# Patient Record
Sex: Female | Born: 1980 | Race: Black or African American | Hispanic: No | Marital: Married | Smoking: Never smoker
Health system: Southern US, Community
[De-identification: ages and names within clinical notes are randomized; demographics above are authoritative.]

## PROBLEM LIST (undated history)

## (undated) ENCOUNTER — Inpatient Hospital Stay (HOSPITAL_COMMUNITY): Payer: Self-pay

## (undated) DIAGNOSIS — J45909 Unspecified asthma, uncomplicated: Secondary | ICD-10-CM

## (undated) DIAGNOSIS — I10 Essential (primary) hypertension: Secondary | ICD-10-CM

## (undated) DIAGNOSIS — L309 Dermatitis, unspecified: Secondary | ICD-10-CM

---

## 2004-11-05 ENCOUNTER — Ambulatory Visit (HOSPITAL_COMMUNITY): Admission: RE | Admit: 2004-11-05 | Discharge: 2004-11-05 | Payer: Self-pay | Admitting: Neurology

## 2005-02-24 DIAGNOSIS — O139 Gestational [pregnancy-induced] hypertension without significant proteinuria, unspecified trimester: Secondary | ICD-10-CM

## 2005-05-29 ENCOUNTER — Observation Stay (HOSPITAL_COMMUNITY): Admission: AD | Admit: 2005-05-29 | Discharge: 2005-05-30 | Payer: Self-pay | Admitting: Obstetrics & Gynecology

## 2005-11-24 ENCOUNTER — Inpatient Hospital Stay (HOSPITAL_COMMUNITY): Admission: AD | Admit: 2005-11-24 | Discharge: 2005-11-27 | Payer: Self-pay | Admitting: Obstetrics & Gynecology

## 2008-02-25 HISTORY — PX: DILATION AND CURETTAGE OF UTERUS: SHX78

## 2010-03-17 ENCOUNTER — Encounter: Payer: Self-pay | Admitting: Neurology

## 2011-10-09 ENCOUNTER — Other Ambulatory Visit: Payer: Self-pay | Admitting: Family Medicine

## 2011-10-09 DIAGNOSIS — L989 Disorder of the skin and subcutaneous tissue, unspecified: Secondary | ICD-10-CM

## 2011-10-10 ENCOUNTER — Ambulatory Visit
Admission: RE | Admit: 2011-10-10 | Discharge: 2011-10-10 | Disposition: A | Discharge status: Home / Self Care | Payer: BC Managed Care – PPO | Source: Ambulatory Visit | Attending: Family Medicine | Admitting: Family Medicine

## 2011-10-10 DIAGNOSIS — L989 Disorder of the skin and subcutaneous tissue, unspecified: Secondary | ICD-10-CM

## 2011-10-13 ENCOUNTER — Other Ambulatory Visit: Payer: Self-pay

## 2014-01-23 IMAGING — US US EXTREM LOW*R* LIMITED
1 series · 9 of 9 positions shown · non-contrast
Comparison: None.

CLINICAL DATA: Palpable nodule shin   lower leg

ULTRASOUND RIGHT LOWER EXTREMITY LIMITED
TECHNIQUE: Ultrasound examination of the region of interest in the
right lower extremity was performed.

[Series 1: us extrem low*right* limited · 0.05mm/px · 9 of 9 slices shown]
[im 1/9]
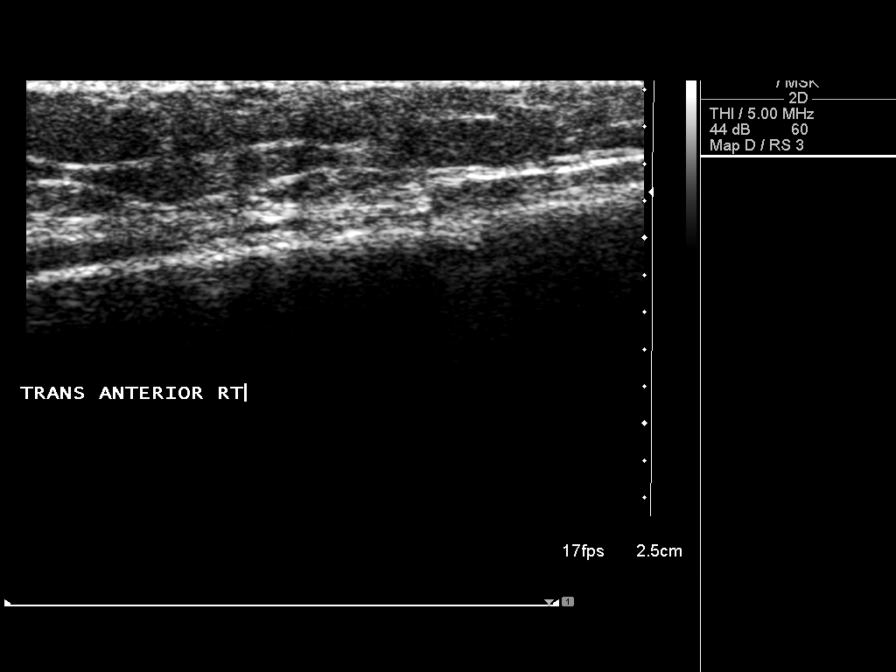
[im 2/9]
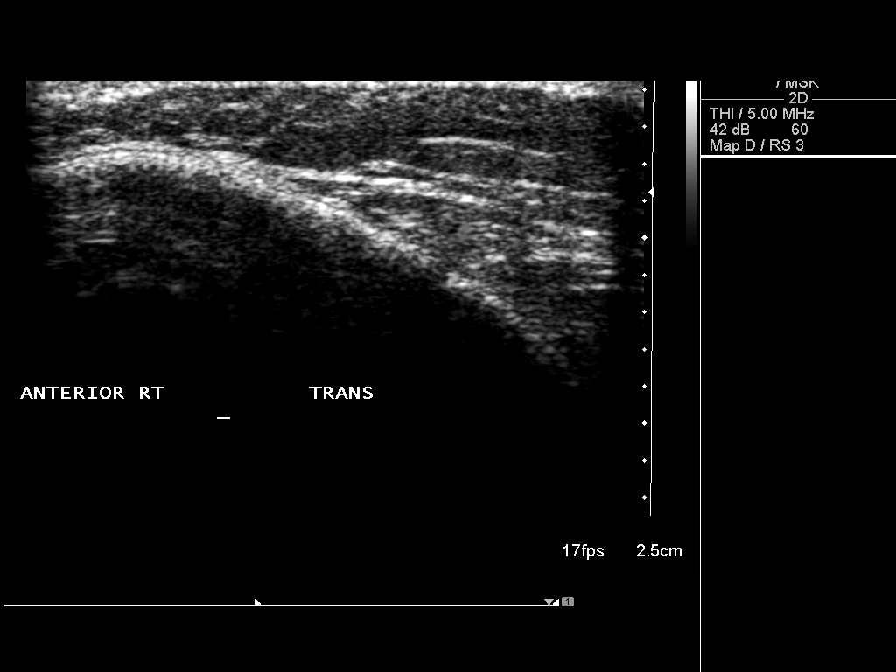
[im 3/9]
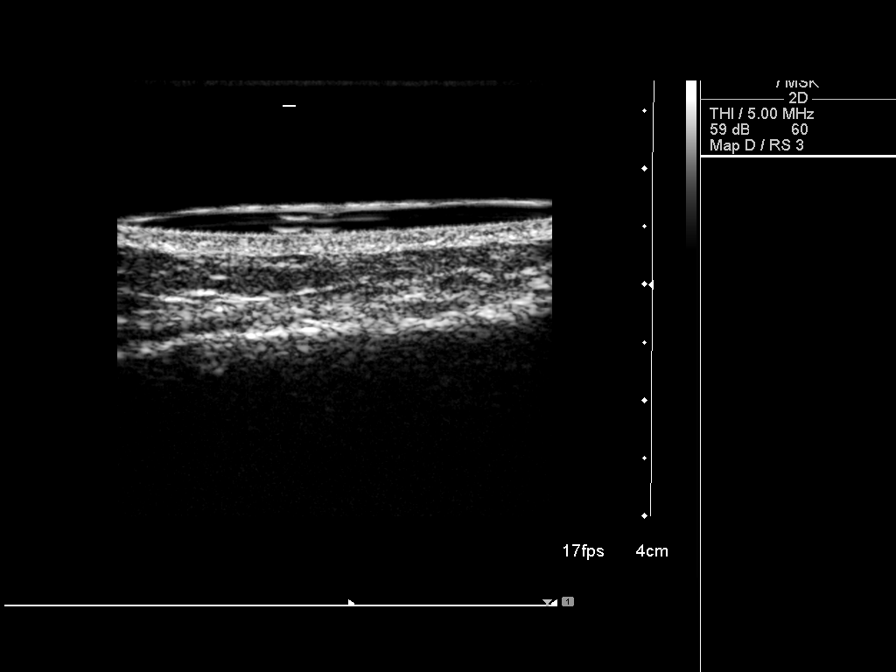
[im 4/9]
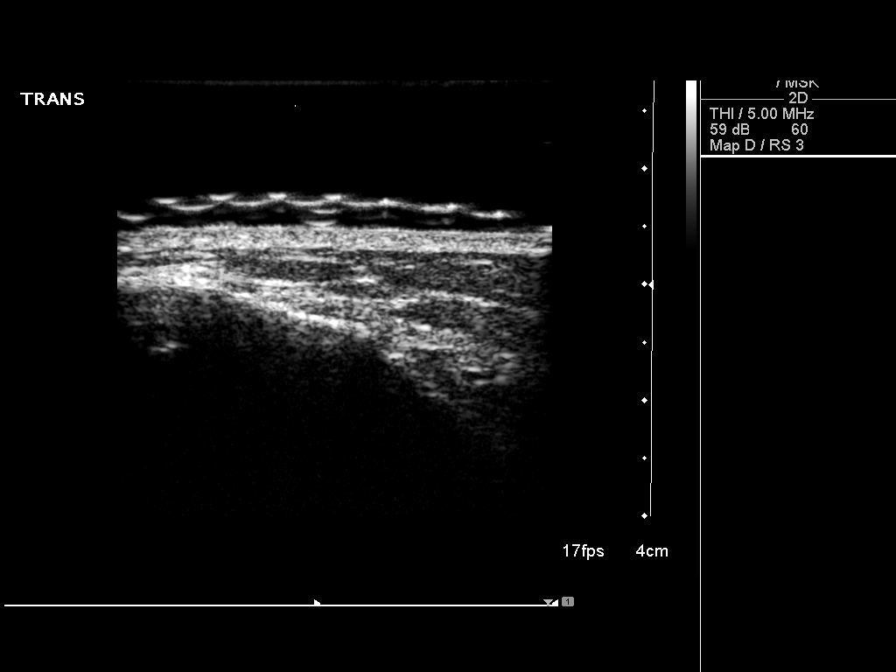
[im 5/9]
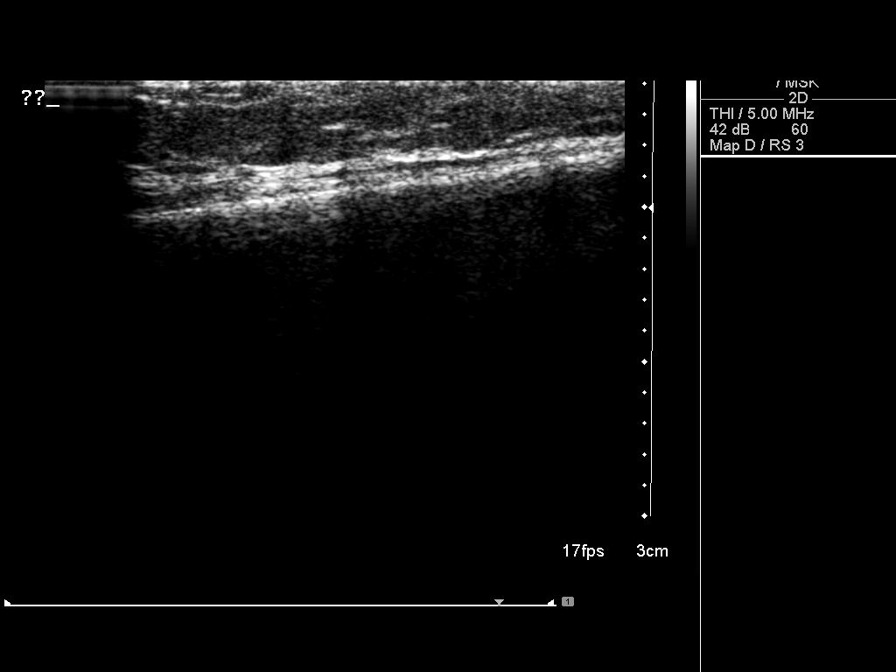
[im 6/9]
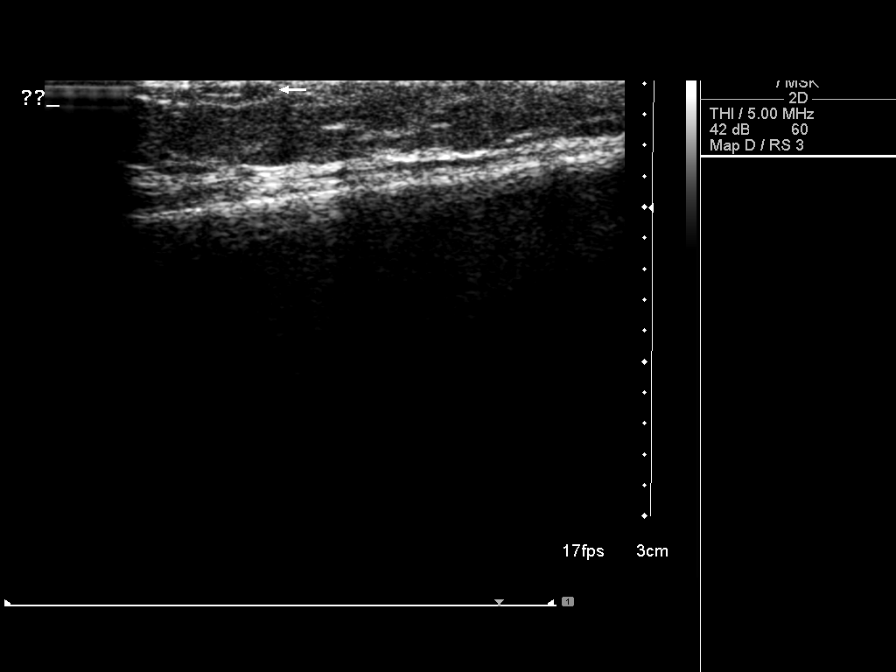
[im 7/9]
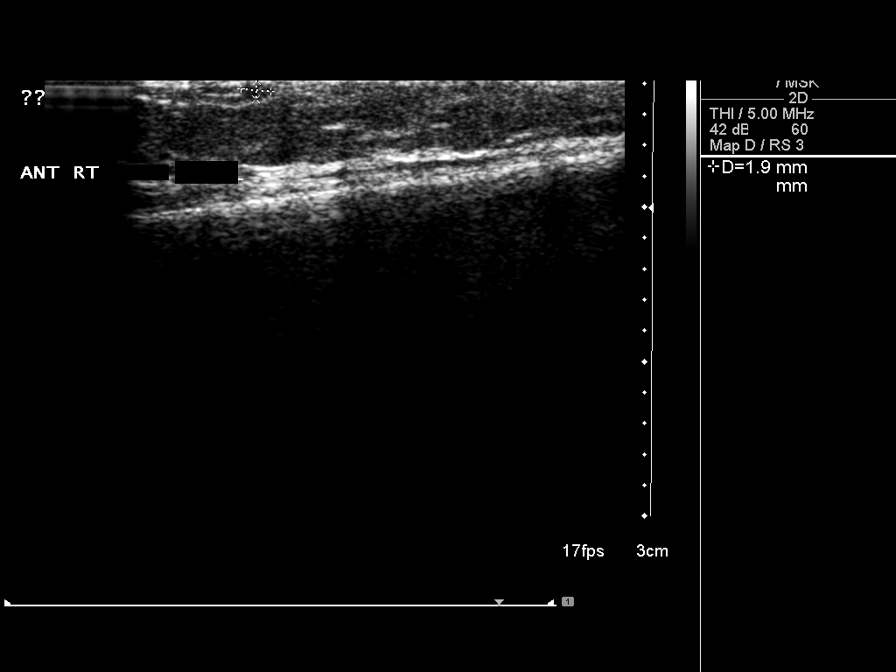
[im 8/9]
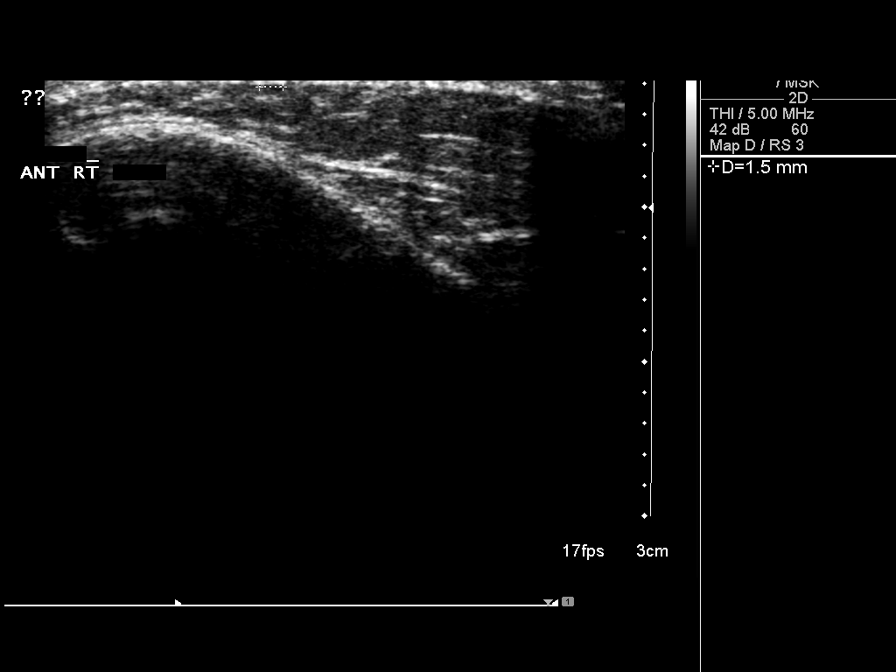
[im 9/9]
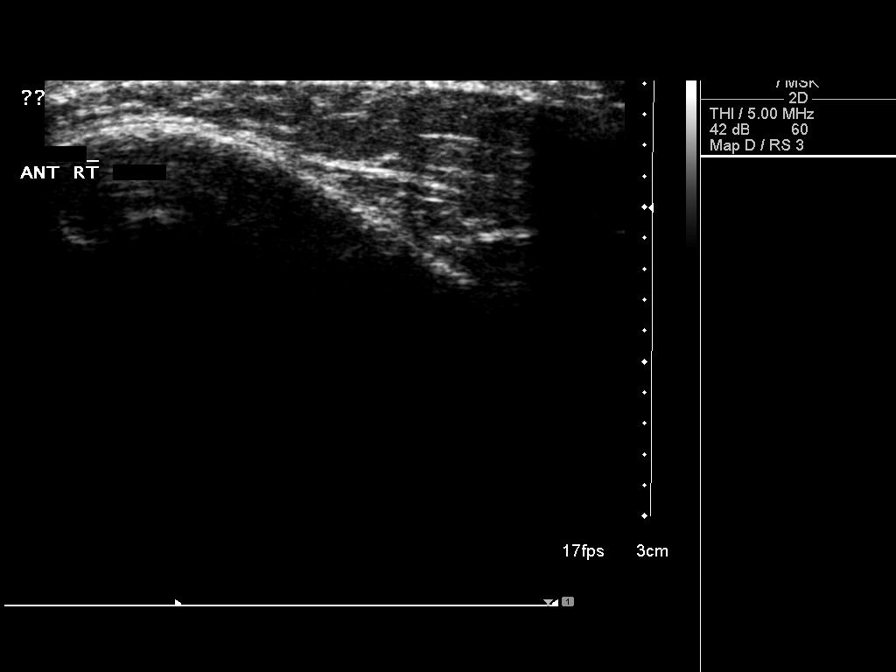

[9 of 9 positions shown; findings below may reference images not displayed]

FINDINGS: Palpable nodule is very small in the subcutaneous
tissues.  This is difficult to identify by ultrasound.  There is a
questionable hypoechoic nodule measuring 1.2 x 1.9 mm in the
subcutaneous tissues.  This is nonspecific and could be due to
scarring or chronic infection.
IMPRESSION: Small palpable abnormality is difficult to identify by ultrasound.
No worrisome cystic or solid mass is identified.

## 2016-01-23 ENCOUNTER — Ambulatory Visit: Payer: Self-pay | Admitting: Sports Medicine

## 2016-02-13 ENCOUNTER — Ambulatory Visit: Payer: Self-pay | Admitting: Sports Medicine

## 2017-06-03 ENCOUNTER — Other Ambulatory Visit: Payer: Self-pay

## 2017-06-03 ENCOUNTER — Inpatient Hospital Stay (HOSPITAL_COMMUNITY)
Admission: AD | Admit: 2017-06-03 | Discharge: 2017-06-05 | DRG: 831 | Disposition: A | Discharge status: Home / Self Care | Payer: 59 | Source: Ambulatory Visit | Attending: Obstetrics and Gynecology | Admitting: Obstetrics and Gynecology

## 2017-06-03 ENCOUNTER — Inpatient Hospital Stay (HOSPITAL_COMMUNITY): Payer: 59

## 2017-06-03 ENCOUNTER — Encounter (HOSPITAL_COMMUNITY): Payer: Self-pay

## 2017-06-03 DIAGNOSIS — Z3A28 28 weeks gestation of pregnancy: Secondary | ICD-10-CM

## 2017-06-03 DIAGNOSIS — Z880 Allergy status to penicillin: Secondary | ICD-10-CM | POA: Diagnosis not present

## 2017-06-03 DIAGNOSIS — O09523 Supervision of elderly multigravida, third trimester: Secondary | ICD-10-CM

## 2017-06-03 DIAGNOSIS — O368330 Maternal care for abnormalities of the fetal heart rate or rhythm, third trimester, not applicable or unspecified: Secondary | ICD-10-CM | POA: Diagnosis not present

## 2017-06-03 DIAGNOSIS — O368390 Maternal care for abnormalities of the fetal heart rate or rhythm, unspecified trimester, not applicable or unspecified: Secondary | ICD-10-CM | POA: Diagnosis present

## 2017-06-03 HISTORY — DX: Essential (primary) hypertension: I10

## 2017-06-03 HISTORY — DX: Unspecified asthma, uncomplicated: J45.909

## 2017-06-03 LAB — COMPREHENSIVE METABOLIC PANEL
ALT: 12 U/L — ABNORMAL LOW (ref 14–54)
AST: 17 U/L (ref 15–41)
Albumin: 2.9 g/dL — ABNORMAL LOW (ref 3.5–5.0)
Alkaline Phosphatase: 51 U/L (ref 38–126)
Anion gap: 7 (ref 5–15)
BUN: 12 mg/dL (ref 6–20)
CHLORIDE: 105 mmol/L (ref 101–111)
CO2: 23 mmol/L (ref 22–32)
Calcium: 8.2 mg/dL — ABNORMAL LOW (ref 8.9–10.3)
Creatinine, Ser: 0.5 mg/dL (ref 0.44–1.00)
GFR calc Af Amer: >60 mL/min (ref >60)
GFR calc non Af Amer: >60 mL/min (ref >60)
Glucose, Bld: 73 mg/dL (ref 65–99)
POTASSIUM: 4.3 mmol/L (ref 3.5–5.1)
Sodium: 135 mmol/L (ref 135–145)
Total Bilirubin: 1 mg/dL (ref 0.3–1.2)
Total Protein: 5.8 g/dL — ABNORMAL LOW (ref 6.5–8.1)

## 2017-06-03 LAB — GROUP B STREP BY PCR: GROUP B STREP BY PCR: NEGATIVE

## 2017-06-03 LAB — TYPE AND SCREEN
ABO/RH(D): O POS
Antibody Screen: NEGATIVE

## 2017-06-03 LAB — RAPID HIV SCREEN (HIV 1/2 AB+AG)
HIV 1/2 Antibodies: NONREACTIVE
HIV-1 P24 Antigen - HIV24: NONREACTIVE

## 2017-06-03 LAB — ABO/RH: ABO/RH(D): O POS

## 2017-06-03 MED ORDER — ZOLPIDEM TARTRATE 5 MG PO TABS: 5.0000 mg | ORAL_TABLET | Freq: Every evening | ORAL | Status: DC | PRN

## 2017-06-03 MED ORDER — PRENATAL MULTIVITAMIN CH: 1.0000 | ORAL_TABLET | Freq: Every day | ORAL | Status: DC

## 2017-06-03 MED ORDER — DOCUSATE SODIUM 100 MG PO CAPS: 100.0000 mg | ORAL_CAPSULE | Freq: Every day | ORAL | Status: DC

## 2017-06-03 MED ORDER — LACTATED RINGERS IV BOLUS
1000.0000 mL | Freq: Once | INTRAVENOUS | Status: AC
  Administered 2017-06-03: 1000 mL via INTRAVENOUS

## 2017-06-03 MED ORDER — CALCIUM CARBONATE ANTACID 500 MG PO CHEW: 2.0000 | CHEWABLE_TABLET | ORAL | Status: DC | PRN

## 2017-06-03 MED ORDER — BETAMETHASONE SOD PHOS & ACET 6 (3-3) MG/ML IJ SUSP
12.0000 mg | INTRAMUSCULAR | Status: AC
  Administered 2017-06-03 – 2017-06-04 (×2): 12 mg via INTRAMUSCULAR
  Filled 2017-06-03 (×2): qty 2

## 2017-06-03 MED ORDER — MAGNESIUM SULFATE BOLUS VIA INFUSION
4.0000 g | Freq: Once | INTRAVENOUS | Status: AC
  Administered 2017-06-03: 4 g via INTRAVENOUS
  Filled 2017-06-03: qty 500

## 2017-06-03 MED ORDER — MAGNESIUM SULFATE 40 G IN LACTATED RINGERS - SIMPLE
2.0000 g/h | INTRAVENOUS | Status: AC
  Administered 2017-06-03: 2 g/h via INTRAVENOUS
  Filled 2017-06-03: qty 40

## 2017-06-03 MED ORDER — ACETAMINOPHEN 325 MG PO TABS: 650.0000 mg | ORAL_TABLET | ORAL | Status: DC | PRN

## 2017-06-03 MED ORDER — LACTATED RINGERS IV SOLN
INTRAVENOUS | Status: DC
  Administered 2017-06-03: 19:00:00 via INTRAVENOUS

## 2017-06-03 NOTE — MAU Note (Signed)
Pt sent by Dr. Cherly Hensenousins for BPP for elevated FHR in office. Patient denies pain

## 2017-06-03 NOTE — Consult Note (Signed)
Neonatology Consult Note:  At the request of the patients obstetrician Dr. Murrell Redden I met with Geanie Cooley Ahmed-Goethe who is a 37 y.o. [id number] at 11w4dgestation.  Her husband was also present for the consult via phone.    She presented from the office this afternoon due to fetal tachycardia noted on nst.  She was referred to MAU for further evaluation including ivf and BPP performed.  The BPP was 8/8 however an UKore[place]owed evidence of pericardial effusion.  She is also have mild contractions.    Antepartum course: Complicated by episode of urinary retention but this has resolved.  AMA with negative genetic screening.  Hgb C trait  The current plan of care includes TORCH titers, varicella, parvo, type/screen to eval for hydrops.  RPR sent from office today and results pending.  If the fetus is persistently tachycardic above 200 without a clear cause she may need a fetal echo and peds cardiology consult.  She is receiving betamethasone and magnesium sulfate both as a tocolytic and for neuro protection.  We discussed morbidity/mortality at this gestional age and also discussed the implications that the pericardial effusion may have on his care. We discussed delivery room resuscitation, including intubation and surfactant in the DR.  Discussed mechanical ventilation and risk for chronic lung disease, risk for IVH with potential for motor / cognitive deficits, ROP, NEC, sepsis, as well as temperature instability and feeding immaturity.  Discussed NG / OG feeds, benefits of MBM in reducing incidence of NEC.   Discussed likely length of stay.  Thank you for allowing [place] participate in her care.  Please call with questions.  BHiginio Roger DO  Neonatologist  The total length of face-to-face or floor / unit time for this encounter was 30 minutes.  Counseling and / or coordination of care was greater than fifty percent of the time.

## 2017-06-03 NOTE — MAU Provider Note (Signed)
History     Chief Complaint  Patient presents with  . Non-stress Test  elevated fetal heart rate   36 yo G4P2012 MBF @ 28 6/[redacted] weeks gestation sent from office for further evaluation of fetal tachycardia noted in the office on routine exam. FHR 180's to 190's No maternal fever or recent viral infection. (+) FM denies ctx. Nl 1hr GCT. Nl wbc on CBC. Maternal pulse 85 in office OB History    Gravida  4   Para  2   Term  2   Preterm      AB  1   Living  2     SAB  1   TAB      Ectopic      Multiple      Live Births  2           No past medical history on file.    No family history on file.  Social History   Tobacco Use  . Smoking status: Not on file  Substance Use Topics  . Alcohol use: Not on file  . Drug use: Not on file    Allergies:  Allergies  Allergen Reactions  . Penicillins Nausea And Vomiting    No medications prior to admission.     Physical Exam   Blood pressure 111/73, pulse 86, temperature 98.3 F (36.8 C), temperature source Oral, resp. rate 18, height 5\' 3"  (1.6 m), weight 59.5 kg (131 lb 3.2 oz), SpO2 100 %.  No exam performed today, done in office. VE( office) l/c/p firm Tracing: baseline 180's min variability (+) ctx  ED Course  IMP: fetal tachycardia IUP@ 28 6/7 weeks P) BPP/EFW. MFM consult  MDM   Serita KyleSheronette A Annalia Metzger, MD 1:12 PM 06/03/2017

## 2017-06-03 NOTE — Progress Notes (Signed)
Patient sitting up eating dinner, will readjust monitor when she is finished.

## 2017-06-03 NOTE — MAU Note (Signed)
Pt sent from office for prolonged FHR in 180-190's

## 2017-06-03 NOTE — H&P (Addendum)
Laura Morgan is a 37 y.o. [id number] at 50w4dgestation presents from office d/t fetal tachycardia noted on nst that persisted.  Pt then sent to MAU for further evaluation including ivf and bpp performed.  Patient denies any fever, chills, sweats, recent known infection, no known sick contacts.  Denies any vaginal bleeding, lof, or contractions.  Notes fetal movement.   CBC/RPR/GTT done today at office. 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 Antepartum course: Complicated by episode of urinary retention but this has resolved.  [place] with negative genetic screening[place] [facility] [place]  See complete pre-natal records  History OB History    Gravida  1   Para      Term      Preterm      AB      Living        SAB      TAB      Ectopic      Multiple      Live Births             PMH: Hgb C trait Family History: family history is not on file. Social History:  has no tobacco, alcohol, and drug history on file.  ROS: See above otherwise negative  Prenatal labs:  ABO, Rh:  pos Antibody:  neg Rubella:  immune RPR:   neg HBsAg:   neg HIV:  neg GBS:    1 hr Glucola: Normal Genetic screening: Normal Anatomy [place] Normal  Physical Exam:     Blood pressure 111/73, pulse 86, temperature 98.1 F (36.7 C), resp. rate 18, height '5\' 3"'$  (1.6 m), weight 131 lb 3.2 oz (59.5 kg), SpO2 100 %. A&O x 3 HEENT: Normal Lungs: CTAB CV: RRR Abdominal: Soft, Non-tender and Gravid Lower Extremities: Non-edematous, Non-tender  Pelvic Exam:      Dilatation: Closed     Effacement: Thick  Per exam by Dr. CGarwin Brothersat office  CBC:  No results found for: WBC, RBC, HGB, HCT, MCV, MCH, MCHC, RDW, PLT CMP: No results found for: NA, K, CL, CO2, GLUCOSE, BUN, CREATININE, CALCIUM, PROT, AST, ALT, ALBUMIN, ALKPHOS, BILITOT, GFRNONAA, GFRAA, ANIONGAP Urine: No results found for: COLORURINE, APPEARANCEUR, LABSPEC, PHURINE, GLUCOSEU, HGBUR, BILIRUBINUR, KETONESUR, PROTEINUR,  NITRITE, LEUKOCYTESUR   Prenatal Transfer Tool  Maternal Diabetes: No - done today and passed - 81 Genetic Screening: Normal Maternal Ultrasounds/Referrals: Normal until today's u/s showing pericardial effusion Fetal Ultrasounds or other Referrals:  None Maternal Substance Abuse:  No Significant Maternal Medications:  None Significant Maternal Lab Results: None  U/S today: bpp 8/8, afi 17.32cm, efw 469%(2'9"), cephalic, fht 1485and fetal tachy noted during entire exam; posterior placenta,  *PERICARDIAL EFFUSION noted  FHT: baseline 180s-190s, normal variability, few accelerations, no decelerations Toco: irregular contractions about q 2 min (pt cannot feel)  Assessment/Plan:  37y.o. G1P0 at 266w6destation   1. Persistent Fetal tachycardia with pleural effusion - admit for observation; pt is s/p iv hydration and tachy persisted - bpp done and fetal tachy noted during exam with concerning finding of pleural effusion.  Will get TORCH titers, varicella, parvo, type/screen to eval for hydrops.  Pt did have negative informaseq early in pregnancy and low risk.  RPR sent from office today and results pending.  CBC done in office today and WBC 7.2, H/h: 11.4/33.4, plts 209.  Will admit for observation and continuous monitoring.  Recommendations per MFM (Dr. NiNelda Severereviewed with Dr CoGarwin Brothersnd, in addition to above labs and  monitoring, watch for tachy persistently above 200 and no clear cause of tachy, may need fetal echo and peds cardiology consult (possible transplacental therapy).  Will monitor closely. 2.  AMA - neg nipt/afp 3. Hgb C trait 4. Contractions - cx closed in office, may require tocolytic if persist 5. Gtt nml 6. Prematurity - betamethasone x1 now, repeat in 24 hrs; nicu consult  Contractions have persisted - have reviewed findings with Dr Garwin Brothers who is primary provider and will begin Magnesium sulfate for neuro protection and also for ptc. - will follow closely  Charyl Bigger 06/03/2017, 5:48 PM

## 2017-06-04 LAB — HSV 1 ANTIBODY, IGG: HSV 1 GLYCOPROTEIN G AB, IGG: 1.15 {index} — AB (ref 0.00–0.90)

## 2017-06-04 LAB — CMV ANTIBODY, IGG (EIA): CMV AB - IGG: 0.88 U/mL — AB (ref 0.00–0.59)

## 2017-06-04 LAB — HSV 2 ANTIBODY, IGG

## 2017-06-04 LAB — VARICELLA ZOSTER ANTIBODY, IGM

## 2017-06-04 LAB — VARICELLA ZOSTER ANTIBODY, IGG: VARICELLA IGG: 1536 {index} (ref >165)

## 2017-06-04 LAB — RUBELLA SCREEN: Rubella: 4.32 index (ref >0.99)

## 2017-06-04 LAB — TOXOPLASMA GONDII ANTIBODY, IGG

## 2017-06-04 NOTE — Progress Notes (Signed)
Fetal echo ordered by Dr. Cherly Hensenousins. Dr. Elizebeth Brookingotton office contacted and will be unable to schedule as inpatient today or tomorrow, Cousins contacted to verify that echo could be scheduled as an outpatient procedure. [facility] April [facility] [place]. Consent obtained to provide medical information (report from MFM) to Dr. Elizebeth Brookingotton, and report faxed to office. Patient aware of appoint and address given.

## 2017-06-04 NOTE — Progress Notes (Signed)
HD #2 29 wk S/p magnesium  S: no complaint. Good FM Anxious to go home but concern regarding baby  O. BP 101/60 (BP Location: Right Arm)   Pulse 86   Temp 97.8 F (36.6 C) (Oral)   Resp 16   Ht 5\' 3"  (1.6 m)   Wt 59.5 kg (131 lb 3.2 oz)   LMP  (Exact Date)   SpO2 100%   BMI 23.24 kg/m  Lungs clear to A Cor RRR Abd: gravid nontender Pelvic deferred extr no edema  Tracing reviewed: baseline 160's min to mod variability. Some variable No ctx [place] Mfm Fetal Bpp Wo Non Stress  Result Date: 06/03/2017 ----------------------------------------------------------------------  OBSTETRICS REPORT                      (Signed Final 06/03/2017 04:35 pm) ---------------------------------------------------------------------- Patient Info  ID #:       [id number]                          D.O.B.:  07-07-1980 (36 yrs)  Name:       SARIKA BALDINI AHMED-                 Visit Date: 06/03/2017 03:14 pm              GOETHE ---------------------------------------------------------------------- Performed By  Performed By:     Fayne Norrie BS,      Ref. Address:     [facility]                                                             [facility]                                                             [facility]                                                             [address]                                                             [place], [place]                                                             [address]  Attending:        Rema Fendt  MD      [facility]         [facility]  Referred By:      Nena JordanSHERONETTE                    Chabeli Barsamian MD ---------------------------------------------------------------------- Orders   #  Description                                 Code   1  US MFM FETAL BPP WO NON STRESS              76819.01   2  US MFM OB DETAIL +14 WK                     76811.01  ----------------------------------------------------------------------   #  Ordered By                Order #        Accession #    Episode #   1  Nena JordanSHERONETTE               [id number]       [id number]     [id number]      Silvie Obremski   2  Nena JordanSHERONETTE               [id number]       [id number]     [id number]      Rebbecca Osuna  ---------------------------------------------------------------------- Indications   [redacted] weeks gestation of pregnancy                Z3A.28   Maternal care for fetal tachycardia during     O36.8390   pregnancy  ---------------------------------------------------------------------- OB History  Gravidity:    4         Term:   2         SAB:   1 ---------------------------------------------------------------------- Fetal Evaluation  Num Of Fetuses:     1  Fetal Heart         191  Rate(bpm):  Cardiac Activity:   Observed  Presentation:       Cephalic  Placenta:           Posterior, above cervical os  P. Cord Insertion:  Visualized, central  Amniotic Fluid  AFI FV:      Subjectively within normal limits  AFI Sum(cm)     %Tile       Largest Pocket(cm)  17.32           65          5.19  RUQ(cm)       RLQ(cm)       LUQ(cm)        LLQ(cm)  2.22          4.78          5.19           5.13 ---------------------------------------------------------------------- Biophysical Evaluation  Amniotic F.V:   Within normal limits       F. Tone:        Observed  F. Movement:    Observed                   Score:          8/8  F. Breathing:   Observed ---------------------------------------------------------------------- Biometry  BPD:        70  mm     G. Age:  28w  1d         24  %    CI:        71.64   %    70 - 86                                                          FL/HC:      19.9   %    19.6 - 20.8  HC:      263.3  mm     G. Age:  28w 4d         22  %    HC/AC:      1.10        0.99 - 1.21  AC:      238.3  mm     G. Age:  28w 1d         30  %    FL/BPD:     74.9   %    71 - 87  FL:       52.4  mm     G. Age:  27w 6d         19  %    FL/AC:      22.0   %    20 - 24  HUM:      47.3  mm     G. Age:  27w 6d         29  %   CER:      33.4  mm     G. Age:  29w 1d         59  %  Est. FW:    1175  gm      2 lb 9 oz     41  % ---------------------------------------------------------------------- Gestational Age  Clinical EDD:  28w 4d                                        EDD:   08/22/17  U/S Today:     28w 1d                                        EDD:   08/25/17  Best:          28w 4d     Det. By:  Clinical EDD             EDD:   08/22/17 ---------------------------------------------------------------------- Anatomy  Cranium:               Appears normal         Aortic Arch:            Appears normal  Cavum:                 Appears normal         Ductal Arch:            Not well visualized  Ventricles:            Appears normal         Diaphragm:  Not well visualized  Choroid Plexus:        Appears normal         Stomach:                Appears normal, left                                                                        sided  Cerebellum:            Appears normal         Abdomen:                Appears normal  Posterior Fossa:       Appears normal         Abdominal Wall:         Not well visualized  Nuchal Fold:           Not applicable (>20    Cord Vessels:           Appears normal ([redacted]                         wks GA)                                        vessel cord)  Face:                  Appears normal         Kidneys:                Appear normal                         (orbits and profile)  Lips:                  Appears normal         Bladder:                Appears normal  Heart:                 Appears normal         Spine:                  Appears normal                         (4CH, axis, and situs  RVOT:                  Not well visualized    Upper Extremities:      Appears normal; no                                                                        hands  LVOT:  Appears normal         Lower Extremities:      Appears normal  ---------------------------------------------------------------------- Cervix Uterus Adnexa  Cervix  Normal appearance by transabdominal scan, although lower uterine  segment contraction ---------------------------------------------------------------------- Comments  Fetal tachycardia was noted throughout today's ultrasound in  addition there is also a pericardial effusion present.  This is  concerning for evolving hydrops due to sustained fetal  tachycardia.  However, at this time it is unclear for how long,  or for what percentage of the time the fetus has been/is  tachycardic.  Thus, Ms. Ahmed-Goethe should receive  continuous fetal monitoring overnight to determine if the  tachycardia is sustained, and if so at what rate.  The work-up  should also include TORCH titers, parvo titers, and a type  and screen to look for immune hydrops.  Ms. Ahmed-Goethe  reports she had genetic screening, but I do not have those  record to review at this time.  Depending upon what has been  already done, additional genetic testing could also be  considered as a possible cause of non-immune hydrops.  If  the tachycardia persistently remains above 200 and no other  clear cause has been determiend it may be necessary to  begin transplacental therapy for the tachycardia.  Prior to  starting that I would recommend a fetal echocardiogram and  Pediatric Cardiology consultation.  Case discussed with Dr.  Cherly Hensen. ---------------------------------------------------------------------- Impression  Single living intrauterine pregnancy at 28 weeks 4 days.  Appropriate fetal growth (41%).  Normal amniotic fluid volume.  The fetal anatomic survey is not complete.  BPP 8/8.  Fetal tachycardia noted throughout today's exam.  A pleural effusion is seen. ---------------------------------------------------------------------- Recommendations  See comments. ----------------------------------------------------------------------                 Rema Fendt, MD Electronically Signed Final Report   06/03/2017 04:35 pm ----------------------------------------------------------------------  [place] Mfm Ob Detail +14 Wk  Result Date: 06/03/2017 ----------------------------------------------------------------------  OBSTETRICS REPORT                      (Signed Final 06/03/2017 04:35 pm) ---------------------------------------------------------------------- Patient Info  ID #:       [id number]                          D.O.B.:  [birth year] (36 yrs)  Name:       CARO BRUNDIDGE AHMED-                 Visit Date: 06/03/2017 03:14 pm              GOETHE ---------------------------------------------------------------------- Performed By  Performed By:     Fayne Norrie BS,      Ref. Address:     [facility]                    [facility]                                                             [facility]                                                             [address]                                                             [place], [place]                                                             [address]  Attending:        Rema Fendt MD      Location:         [facility]  Referred By:      Nena Jordan                    Temperence Zenor MD ---------------------------------------------------------------------- Orders   #  Description                                 Code   1  [place] MFM FETAL BPP WO NON STRESS              76819.01   2  [place] MFM OB DETAIL +14 WK                     76811.01  ----------------------------------------------------------------------   #  Ordered By               Order #        Accession #    Episode #   1  Nena Jordan               [id number]       [id number]     [id number]      Bijon Mineer   2  Nena Jordan               [id number]       [id number]     [id number]      Aira Sallade  ---------------------------------------------------------------------- Indications   [redacted] weeks gestation of  pregnancy                Z3A.28   Maternal care for fetal tachycardia during     O36.8390   pregnancy  ---------------------------------------------------------------------- OB History  Gravidity:    4         Term:   2         SAB:   1 ---------------------------------------------------------------------- Fetal Evaluation  Num Of Fetuses:     1  Fetal Heart         191  Rate(bpm):  Cardiac Activity:   Observed  Presentation:       Cephalic  Placenta:           Posterior, above cervical os  P. Cord Insertion:  Visualized, central  Amniotic Fluid  AFI FV:      Subjectively within normal limits  AFI Sum(cm)     %Tile       Largest Pocket(cm)  17.32           65          5.19  RUQ(cm)       RLQ(cm)       LUQ(cm)        LLQ(cm)  2.22          4.78          5.19           5.13 ---------------------------------------------------------------------- Biophysical Evaluation  Amniotic F.V:   Within normal limits       F. Tone:        Observed  F. Movement:    Observed                   Score:          8/8  F. Breathing:   Observed ---------------------------------------------------------------------- Biometry  BPD:        70  mm     G. Age:  28w 1d         24  %    CI:        71.64   %    70 - 86                                                          FL/HC:      19.9   %    19.6 - 20.8  HC:      263.3  mm     G. Age:  28w 4d         22  %    HC/AC:      1.10        0.99 - 1.21  AC:      238.3  mm     G. Age:  28w 1d         30  %    FL/BPD:     74.9   %    71 - 87  FL:       52.4  mm     G. Age:  27w 6d         19  %    FL/AC:      22.0   %    20 - 24  HUM:      47.3  mm     G. Age:  27w 6d         29  %  CER:      33.4  mm     G. Age:  29w 1d         59  %  Est. FW:    1175  gm      2 lb 9 oz     41  % ---------------------------------------------------------------------- Gestational Age  Clinical EDD:  28w 4d                                        EDD:   08/22/17  U/S Today:     28w 1d  EDD:   08/25/17  Best:          Eden Emms 4d     Det. By:  Clinical EDD             EDD:   08/22/17 ---------------------------------------------------------------------- Anatomy  Cranium:               Appears normal         Aortic Arch:            Appears normal  Cavum:                 Appears normal         Ductal Arch:            Not well visualized  Ventricles:            Appears normal         Diaphragm:              Not well visualized  Choroid Plexus:        Appears normal         Stomach:                Appears normal, left                                                                        sided  Cerebellum:            Appears normal         Abdomen:                Appears normal  Posterior Fossa:       Appears normal         Abdominal Wall:         Not well visualized  Nuchal Fold:           Not applicable (>20    Cord Vessels:           Appears normal ([redacted]                         wks GA)                                        vessel cord)  Face:                  Appears normal         Kidneys:                Appear normal                         (orbits and profile)  Lips:                  Appears normal         Bladder:                Appears normal  Heart:                 Appears normal  Spine:                  Appears normal                         (4CH, axis, and situs  RVOT:                  Not well visualized    Upper Extremities:      Appears normal; no                                                                        hands  LVOT:                  Appears normal         Lower Extremities:      Appears normal ---------------------------------------------------------------------- Cervix Uterus Adnexa  Cervix  Normal appearance by transabdominal scan, although lower uterine  segment contraction ---------------------------------------------------------------------- Comments  Fetal tachycardia was noted throughout today's ultrasound in  addition there is also a pericardial effusion present.   This is  concerning for evolving hydrops due to sustained fetal  tachycardia.  However, at this time it is unclear for how long,  or for what percentage of the time the fetus has been/is  tachycardic.  Thus, Ms. Ahmed-Goethe should receive  continuous fetal monitoring overnight to determine if the  tachycardia is sustained, and if so at what rate.  The work-up  should also include TORCH titers, parvo titers, and a type  and screen to look for immune hydrops.  Ms. Ahmed-Goethe  reports she had genetic screening, but I do not have those  record to review at this time.  Depending upon what has been  already done, additional genetic testing could also be  considered as a possible cause of non-immune hydrops.  If  the tachycardia persistently remains above 200 and no other  clear cause has been determiend it may be necessary to  begin transplacental therapy for the tachycardia.  Prior to  starting that I would recommend a fetal echocardiogram and  Pediatric Cardiology consultation.  Case discussed with Dr.  Cherly Hensen. ---------------------------------------------------------------------- Impression  Single living intrauterine pregnancy at 28 weeks 4 days.  Appropriate fetal growth (41%).  Normal amniotic fluid volume.  The fetal anatomic survey is not complete.  BPP 8/8.  Fetal tachycardia noted throughout today's exam.  A pleural effusion is seen. ---------------------------------------------------------------------- Recommendations  See comments. ----------------------------------------------------------------------                 Rema Fendt, MD Electronically Signed Final Report   06/03/2017 04:35 pm ----------------------------------------------------------------------  Labs reviewed with pt  And husband: nl labs so far except (+) CMV IgG IMP: fetal tachycardia improving IUP@ 29 weeks s/p IV magnesium. BMZ complete today P) disc fetal echo. Will try to obtain while here. Cont with  Monitoring given fetal  tracing Remain inpt at this time

## 2017-06-05 LAB — TORCH-IGM(TOXO/ RUB/ CMV/ HSV) W TITER
CMV IgM: <30 AU/mL (ref 0.0–29.9)
HSVI/II COMB AB IGM: 1.74 ratio — AB (ref 0.00–0.90)
Rubella IgM: <20 AU/mL (ref 0.0–19.9)
Toxoplasma Antibody- IgM: <3 AU/mL (ref 0.0–7.9)

## 2017-06-05 LAB — INFECT DISEASE AB IGM REFLEX 1

## 2017-06-05 LAB — PARVOVIRUS B19 ANTIBODY, IGG AND IGM
Parovirus B19 IgG Abs: 6 index — ABNORMAL HIGH (ref 0.0–0.8)
Parovirus B19 IgM Abs: 0.3 index (ref 0.0–0.8)

## 2017-06-05 MED ORDER — NIFEDIPINE 10 MG PO CAPS
10.0000 mg | ORAL_CAPSULE | Freq: Four times a day (QID) | ORAL | Status: DC
  Administered 2017-06-05: 10 mg via ORAL
  Filled 2017-06-05: qty 1

## 2017-06-05 MED ORDER — NIFEDIPINE 10 MG PO CAPS: 10.0000 mg | ORAL_CAPSULE | ORAL | 0 refills | Status: AC | PRN

## 2017-06-05 NOTE — Discharge Instructions (Signed)

## 2017-06-05 NOTE — Progress Notes (Signed)
Called Dr. Cherly Hensenousins about patients ctx and uterine irritability. Dr. Lenna Gilfordrdered procardia 10 mg. Patient was educated on side effects which included lowering blood pressure. Patients blood pressure runs low and RN will keep an eye on BP.

## 2017-06-05 NOTE — Progress Notes (Signed)
HD#3 29 1/7 weeks S; no complaints Good FM  denies ctx  O: BP (!) 105/58 (BP Location: Right Arm)   Pulse 67   Temp 98.3 F (36.8 C) (Oral)   Resp 16   Ht 5\' 3"  (1.6 m)   Wt 59.5 kg (131 lb 3.2 oz)   LMP  (Exact Date)   SpO2 100%   BMI 23.24 kg/m  Lungs clear to A  Cor RRR  Abd gravid nontender Pelvic cervix l/c/firm/pp out of pelvis  Tracing: baseline 140 good variability. occ variables No ctx currently Labs: (+) CMV IGG Rubella Immune Varicella positive Toxoplasmosis neg HSV1 I gg  Positive Parvovirus titer pending  IMP: fetal tachycardia resolved  PMC IUP @ 29 1/7 weeks P) d/c home. Fetal echo outpt. F/u office Tuesday. Daily kick ct ct. ptl prec. Procardia prn

## 2017-06-05 NOTE — Discharge Summary (Signed)
Physician Discharge Summary  Patient ID: Laura Morgan MRN: [id number] DOB/AGE: [birth year] 37 y.o.  Admit date: 06/03/2017 Discharge date: 06/05/2017  Admission Diagnoses: fetal tachycardia affecting mgmt of mother IUP@ 28 6/7 weeks  Discharge Diagnoses:  Active Problems:   Fetal tachycardia affecting management of mother Premature contractions IUP@ 29 1/7 weeks  Discharged Condition: stable  Hospital Course: [facility] to FHR 180's-190's on incidental findings at routine office visit and  Reaffirmed at the hospital. Pt had nl wbc( cbc at office). Nl 1hr GCT there as well. Pt had no fever and nl maternal pulse. Pt denies any recent infection. Pt was seen by MFM and underwent sono for fetal growth, BPP.  Findings notable was fetal pericardial effusion, AGA baby.  Recommendations made for admission with possible fetal echo if FHr remains in the 200's. Extensive labs recommended and done with the only finding is that of prior infection with CMV. During the stay, pt was placed on continuous monitoring. She was noted to be contracting but not perceived by pt. She was given magnesium for neuro prophylaxis as well as [place]. BMZ completed. Pt was also ultimately given procardia for frequent reg contraction although not felt by pt. Exam showed cervix remained long/closed/pp out of pelvis The fetal heart rate normalized on d3 of hospitalization. Request for fetal echo was not attainable while hospitalized. Pt is scheduled at [facility] next wednesday  Consults: MFM  Significant Diagnostic Studies: labs: see list, OB sonogram  Treatments: IV hydration, steroids: betamethasone and magnesium Discharge Exam: Blood pressure (!) 105/58, pulse 67, temperature 98.3 F (36.8 C), temperature source Oral, resp. rate 16, height 5\' 3"  (1.6 m), weight 59.5 kg (131 lb 3.2 oz), SpO2 100 %. General appearance: alert, cooperative and no distress Resp: clear to auscultation  bilaterally Cardio: regular rate and rhythm, S1, S2 normal, no murmur, click, rub or gallop GI: gravid soft non tender Pelvic: external genitalia normal and cervix long/closed/firm, uterus gravid Extremities: no edema, redness or tenderness in the calves or thighs  Disposition: Discharge disposition: 01-Home or Self Care       Discharge Instructions    Activity as tolerated - No restrictions   Complete by:  As directed    Diet general   Complete by:  As directed    Discharge instructions   Complete by:  As directed    Daily kick ct, call if decreased fetal movement, six or more contractions per hour, vaginal bleeding      Follow-up Information    Maxie Betterousins, Crandall Harvel, MD Follow up on 06/09/2017.   Specialty:  Obstetrics and Gynecology Contact information: [address] [place] [address] [phone]           Signed: Nena JordanSheronette A Kayce Chismar 06/05/2017, 12:40 PM

## 2017-06-17 ENCOUNTER — Encounter (HOSPITAL_COMMUNITY): Payer: Self-pay | Admitting: *Deleted

## 2017-06-17 ENCOUNTER — Inpatient Hospital Stay (HOSPITAL_COMMUNITY)
Admission: AD | Admit: 2017-06-17 | Discharge: 2017-06-17 | Discharge status: Against Medical Advice | Payer: 59 | Source: Ambulatory Visit | Attending: Obstetrics and Gynecology | Admitting: Obstetrics and Gynecology

## 2017-06-17 ENCOUNTER — Inpatient Hospital Stay (HOSPITAL_COMMUNITY): Payer: 59

## 2017-06-17 ENCOUNTER — Other Ambulatory Visit: Payer: Self-pay

## 2017-06-17 DIAGNOSIS — O09523 Supervision of elderly multigravida, third trimester: Secondary | ICD-10-CM | POA: Insufficient documentation

## 2017-06-17 DIAGNOSIS — O99413 Diseases of the circulatory system complicating pregnancy, third trimester: Secondary | ICD-10-CM | POA: Insufficient documentation

## 2017-06-17 DIAGNOSIS — O321XX0 Maternal care for breech presentation, not applicable or unspecified: Secondary | ICD-10-CM | POA: Diagnosis not present

## 2017-06-17 DIAGNOSIS — O368330 Maternal care for abnormalities of the fetal heart rate or rhythm, third trimester, not applicable or unspecified: Secondary | ICD-10-CM | POA: Insufficient documentation

## 2017-06-17 DIAGNOSIS — I313 Pericardial effusion (noninflammatory): Secondary | ICD-10-CM | POA: Diagnosis not present

## 2017-06-17 DIAGNOSIS — O368390 Maternal care for abnormalities of the fetal heart rate or rhythm, unspecified trimester, not applicable or unspecified: Secondary | ICD-10-CM

## 2017-06-17 DIAGNOSIS — O99419 Diseases of the circulatory system complicating pregnancy, unspecified trimester: Secondary | ICD-10-CM

## 2017-06-17 DIAGNOSIS — I499 Cardiac arrhythmia, unspecified: Secondary | ICD-10-CM | POA: Insufficient documentation

## 2017-06-17 DIAGNOSIS — Z3A30 30 weeks gestation of pregnancy: Secondary | ICD-10-CM | POA: Diagnosis present

## 2017-06-17 DIAGNOSIS — I471 Supraventricular tachycardia: Secondary | ICD-10-CM

## 2017-06-17 HISTORY — DX: Dermatitis, unspecified: L30.9

## 2017-06-17 NOTE — MAU Provider Note (Addendum)
History     No chief complaint on file.  CC: high fetal heart rate HPI: 37 yo G4P2012 MBF @ 30 6/7 weeks presents for the 2nd time for evaluation of elevated FHR ( 203) in the office today. Pt was admitted to [facility] 4/10-4/12 for FHR in the 180's -190's. 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 Pt was given BMZ complete( 4/10-4/11). Magnesium for neuro prophylaxis. She also had sonogram which showed small pericardial effusion.   Pt was given procardia prn for contractions. Pt underwent  fetal ECHO 4/24. With small fetal pericardial effusion, borderline dilated RV, mild dilated RA   OB History    Gravida  4   Para  2   Term  2   Preterm      AB  1   Living  2     SAB  1   TAB      Ectopic      Multiple      Live Births  2           Past Medical History:  Diagnosis Date  . Asthma    with 2012 pregnancy   . Eczema   . Hypertension    with first pregnancy, resolved    Past Surgical History:  Procedure Laterality Date  . DILATION AND CURETTAGE OF UTERUS  2010    Family History  Problem Relation Age of Onset  . Cancer Father     Social History   Tobacco Use  . Smoking status: Never Smoker  . Smokeless tobacco: Never Used  Substance Use Topics  . Alcohol use: Never    Frequency: Never  . Drug use: Never    Allergies:  Allergies  Allergen Reactions  . Penicillins Nausea And Vomiting    Has patient had a PCN reaction causing immediate rash, facial/tongue/throat swelling, SOB or lightheadedness with hypotension: No Has patient had a PCN reaction causing severe rash involving mucus membranes or skin necrosis: No Has patient had a PCN reaction that required hospitalization: No Has patient had a PCN reaction occurring within the last 10 years: No If all of the above answers are "NO", then may proceed with Cephalosporin use.      Medications Prior to Admission  Medication Sig Dispense Refill Last Dose  . Prenatal Vit-Fe Fumarate-FA (PRENATAL MULTIVITAMIN) TABS tablet Take 1 tablet by mouth daily at 12 noon.   Past Week at Unknown time  . NIFEdipine (PROCARDIA) 10 MG capsule Take 1 capsule (10 mg total) by mouth every 4 (four) hours as needed. (Patient not taking: Reported on 06/17/2017) 30 capsule 0 Not Taking at Unknown time     Physical Exam   Blood pressure 111/64, pulse 90, temperature 97.7 F (36.5 C), temperature source Oral, resp. rate 17, weight 63.3 kg (139 lb 8 oz), SpO2 100 %.  General appearance: alert, cooperative and no distress Lungs: clear to auscultation bilaterally Heart: regular rate and rhythm, S1, S2 normal, no murmur, click, rub or gallop Abdomen: gravid, non tender  Extremity: no  Edema or calf tenderness Pelvic: closed/thick/firm PP out of pelvis Tracing: baseline 190's min variability  (+) ctx  ED Course  IMP: Fetal tachycardia in pregnancy IUP@ 30 6/7 weeks Small pericardial effusion P) spoke with Dr Katherina Right( MFM) . Recommend M mode BPP now. Pt advised may need to transfer to [facility] for mgmt of FHR MDM  Serita KyleSheronette A Ura Hausen, MD 2:42 PM 06/17/2017  Addendum  [place] Mfm Fetal Bpp Wo Non Stress  Result Date: 06/17/2017 ----------------------------------------------------------------------  OBSTETRICS REPORT                      (Signed Final 06/17/2017 04:02 pm) ---------------------------------------------------------------------- Patient Info  ID #:       [id number]                          D.O.B.:  August 17, 1980 (36 yrs)  Name:       Laura LeepAIMAH R AHMED-                 Visit Date: 06/17/2017 02:55 pm              GOETHE ---------------------------------------------------------------------- Performed By  Performed By:     Eden Lathearrie Stalter BS      Ref. Address:     [facility]                    RDMS RVT                                                             [facility]                                                             [address]                                                             [place], [place]                                                             [phone]  Attending:        Durwin NoraJeffrey M Denney       [facility]                    MD  Referred By:      Nena JordanSHERONETTE                    Ashaunti Treptow MD ---------------------------------------------------------------------- Orders   #  Description  Code   1  [place] MFM FETAL BPP WO NON STRESS              [id number]  ----------------------------------------------------------------------   #  Ordered By               Order #        Accession #    Episode #   1  Nena Jordan               [id number]      [id number]     [id number]      Okie Jansson  ---------------------------------------------------------------------- Indications   [redacted] weeks gestation of pregnancy                Z3A.30   Maternal care for fetal tachycardia during     O36.8390   pregnancy   Advanced maternal age multigravida 42+,        O46.523   third trimester   Maternal arrhythmia affecting pregnancy,       O99.413, I49.9   third trimester  ---------------------------------------------------------------------- OB History  Gravidity:    4         Term:   2         SAB:   1 ---------------------------------------------------------------------- Fetal Evaluation  Num Of Fetuses:     1  Fetal Heart         203  Rate(bpm):  Cardiac Activity:   Tachycardia  Presentation:       Breech  Amniotic Fluid  AFI FV:      Subjectively within normal limits  AFI Sum(cm)     %Tile       Largest Pocket(cm)  16.27           59          4.74  RUQ(cm)       RLQ(cm)       LUQ(cm)        LLQ(cm)  4.36          4.44          2.73           4.74 ---------------------------------------------------------------------- Biophysical Evaluation  Amniotic F.V:   Within normal limits       F. Tone:         Observed  F. Movement:    Observed                   Score:          8/8  F. Breathing:   Observed ---------------------------------------------------------------------- Gestational Age  Clinical EDD:  30w 4d                                        EDD:   08/22/17  Best:          30w 4d     Det. By:  Clinical EDD             EDD:   08/22/17 ---------------------------------------------------------------------- Impression  Single living intrauterine pregnancy at 30w 4d.  Breech presentation.  Placenta .  Normal amniotic fluid volume.  a 7-8mm pericardial effusion is noted  no other signs of hydrops are seen  the m-mode echocardiography demonstrated 1:1 atrial to  ventricular contraction ratio with FHR in the 200's; this is  consistent with supraventricular tachycardia  BPP 8/8  I discussed the implications with the patient and need for  an  assessment of the percentage of time the fetus is in SVT and  given the effusion increase that she will likely meet criteria of  at least 50% of the EFM in SVT and will need to be loaded  with digoxin while on telemetry.  The patient insisted that her  OB physician facilitate a transfer of care to [facility]  as she prefers that location as opposed to [place],  [place]. ---------------------------------------------------------------------- Recommendations  I relayed my impression and recommendation to Dr. Cherly Hensen  (patient's OB) who is in process of facilitating desired transfer  to [facility] for inpatient assessment and likely  need for digoxin/sotalol for rate control in this fetus with  worsening pericardial effusion. ----------------------------------------------------------------------               Durwin Nora, MD Electronically Signed Final Report   06/17/2017 04:02 pm ----------------------------------------------------------------------  4;30PDisc with pt and due to family close by to facilitate care of her other children as well as having had fetal  echo there, she prefers transfer to [facility]. The patient logistic center was called. Await MFMHigh risk OB call back 5:20P  Transfer accepted by Dr Maryelizabeth Kaufmann. Copy of  Labs sent. Pt agree with transfer  5:50 pM. Pt declines ambulance transfer. Will go by car to facility

## 2017-06-17 NOTE — MAU Note (Addendum)
Sent from office, Just wanting to check the baby's heart rate, it was high in the office.  They know the reason why, the cardiologist had said if it went high again,  They would start medication to take for remainder of pregnancy.   Has paperwork copy with plan with her

## 2017-06-17 NOTE — Progress Notes (Signed)
RN informed pt that [facility] will be [facility] as requested.  Currently awaiting transport arrangements to facility.  Pt states she's not going to be transported via ambulance as instructed by Dr. Cherly Hensenousins, prefers to drive personal vehicle.

## 2017-06-17 NOTE — Progress Notes (Signed)
Pt left AMA, states she will drive herself [facility].  Pt informed that both Dr. Cherly Hensenousins and Dr. Maryelizabeth KaufmannMunoz, accepting MD [facility] advise against driving personal vehicle & wants pt to transfer via ambulance.

## 2017-06-17 NOTE — Progress Notes (Signed)
[facility] notified, informed pt refuses to await hospital transport and plans to drive herself.

## 2017-11-12 ENCOUNTER — Encounter (HOSPITAL_COMMUNITY): Payer: Self-pay

## 2019-10-01 IMAGING — US US MFM FETAL BPP W/O NON-STRESS
1 series · 15 of 28 positions shown · non-contrast
Comparison: none

[Series 1: us mfm fetal bpp w/o non-stress · 36 acquisitions, 15 frames shown]
[im 1/36]
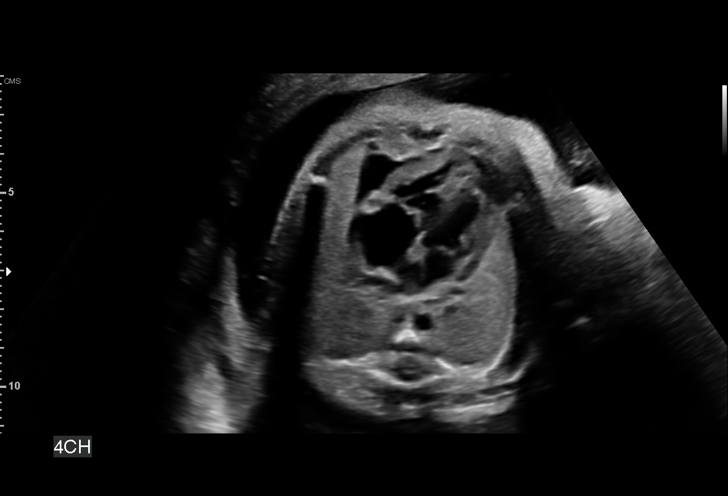
[im 3/36]
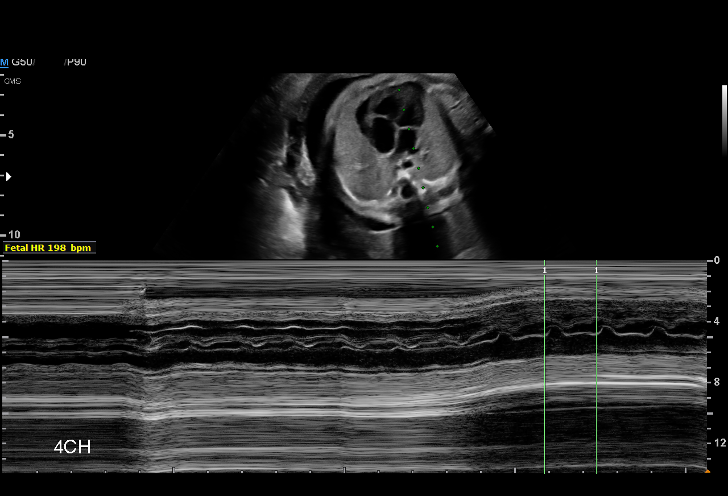
[im 6/36]
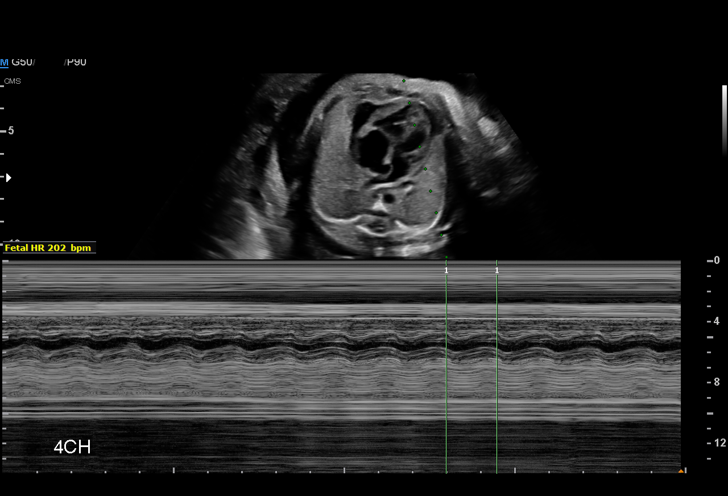
[im 8/36]
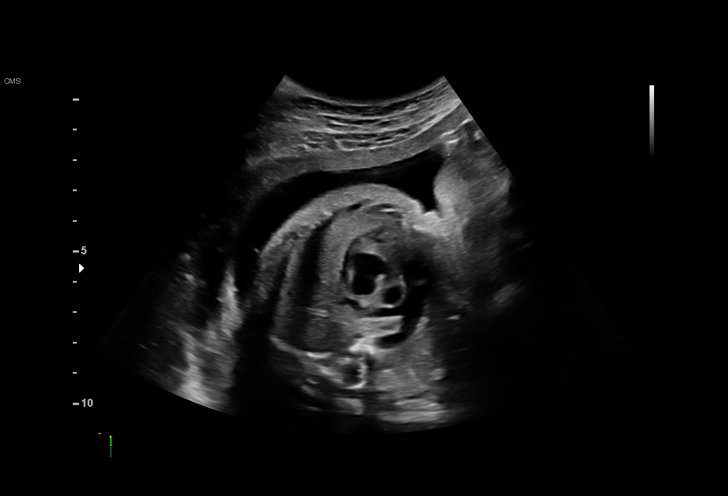
[im 11/36]
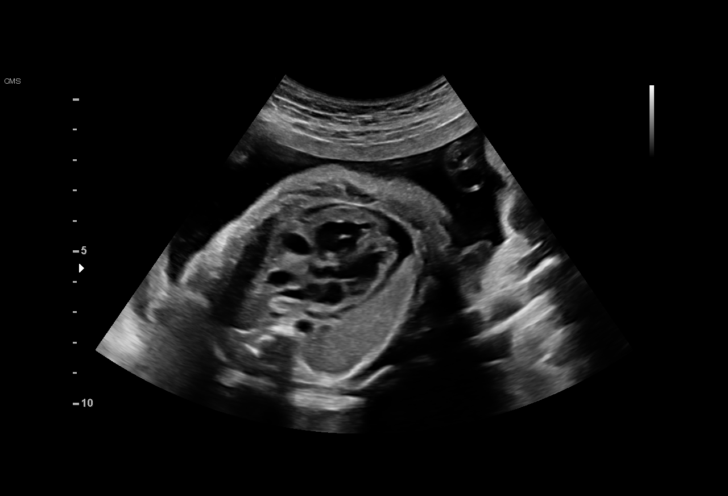
[im 13/36]
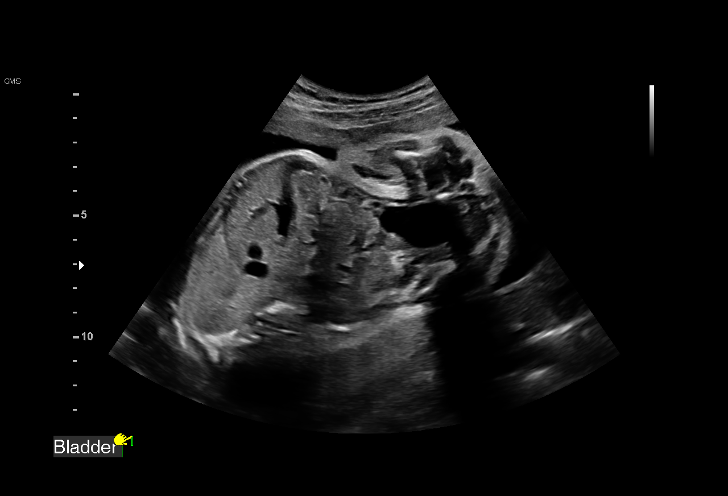
[im 16/36]
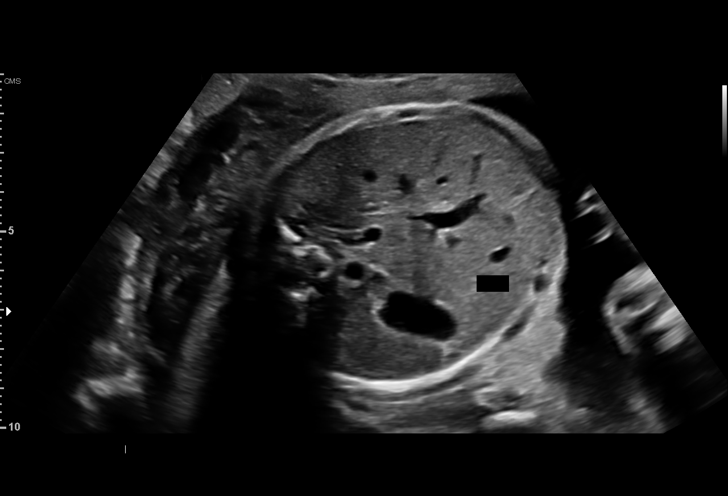
[im 19/36]
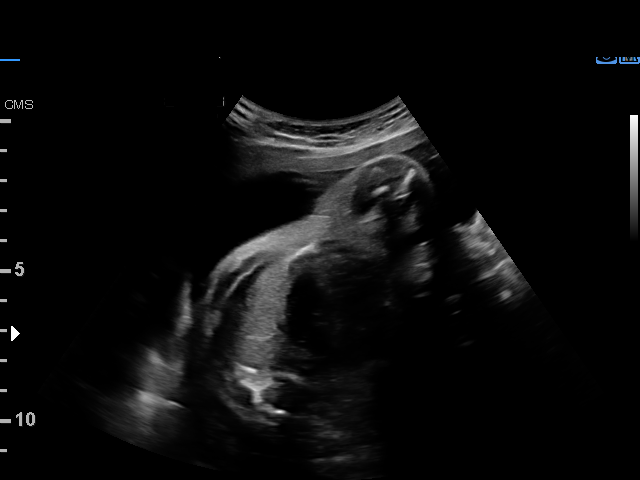
[im 20/36]
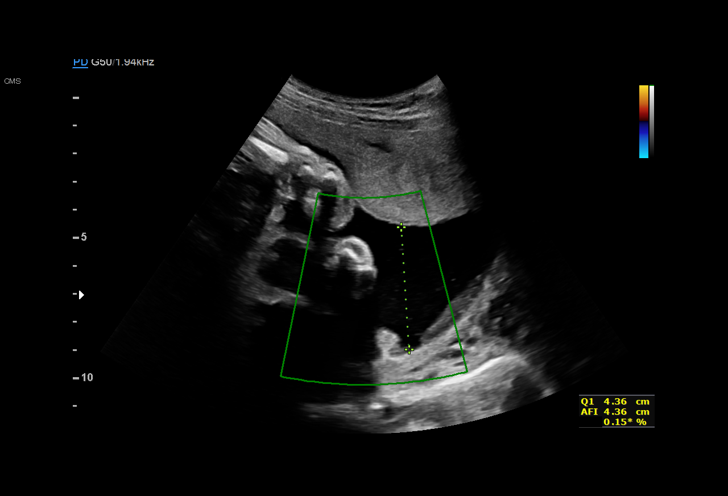
[im 23/36]
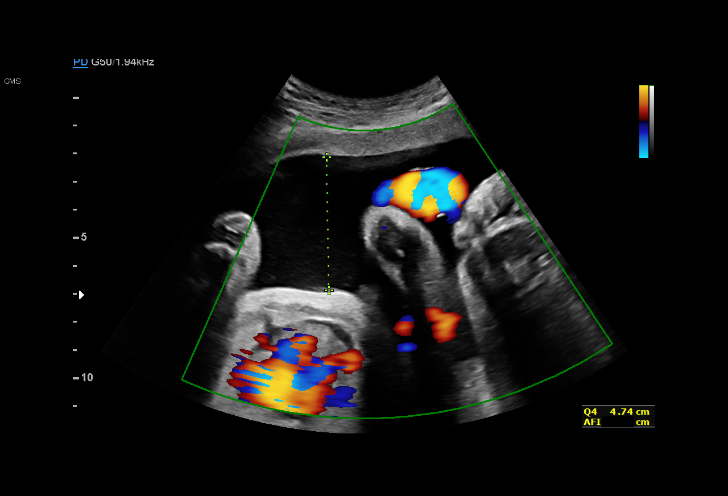
[im 25/36]
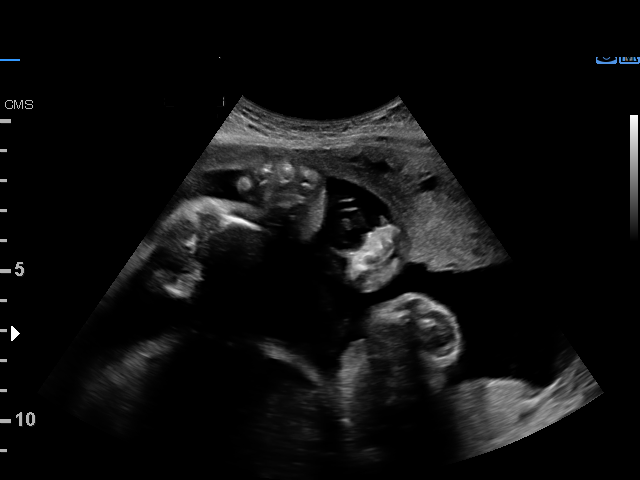
[im 28/36]
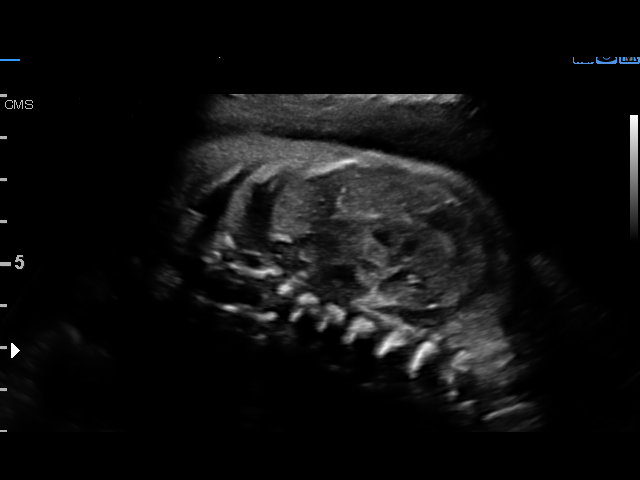
[im 30/36]
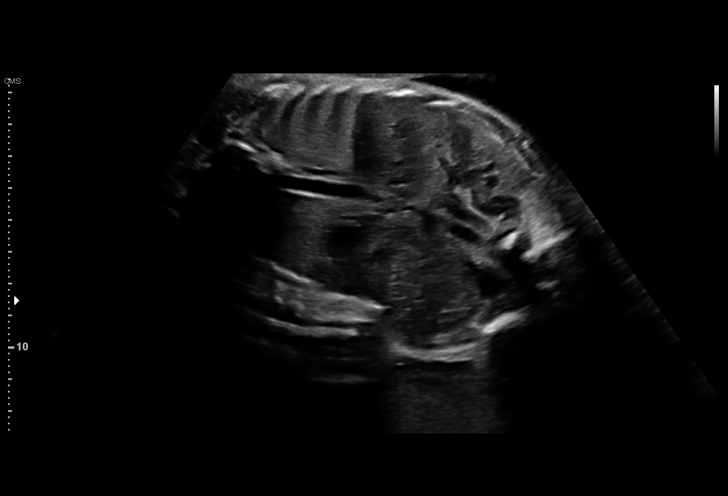
[im 33/36]
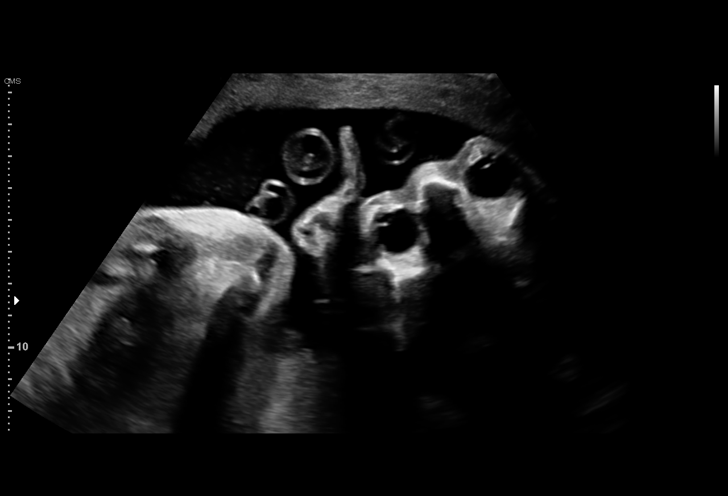
[im 36/36]
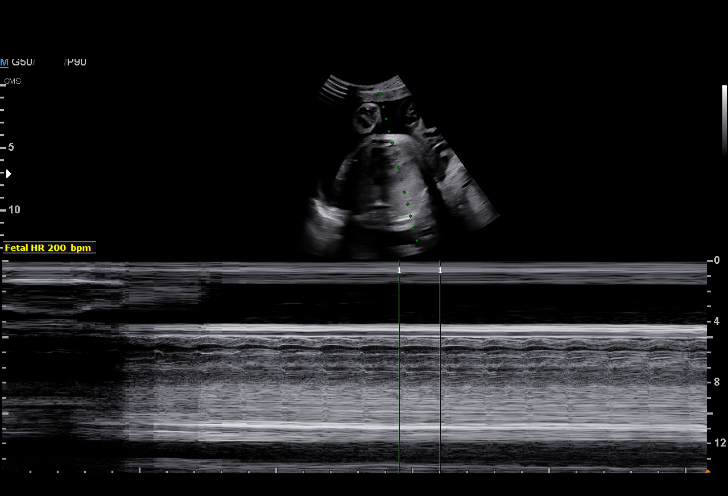

[15 of 28 positions shown; findings below may reference images not displayed]

[facility]

[facility]

1  TARRAR               [id number]      [id number]     [id number]
ZIA
Indications

30 weeks gestation of pregnancy
Maternal care for fetal tachycardia during
pregnancy
Advanced maternal age multigravida 35+,
third trimester
Maternal arrhythmia affecting pregnancy,       O99.413,
third trimester
OB History

Gravidity:    4         Term:   2         SAB:   1
Fetal Evaluation

Num Of Fetuses:     1
Fetal Heart         203
Rate(bpm):
Cardiac Activity:   Tachycardia
Presentation:       Breech

Amniotic Fluid
AFI FV:      Subjectively within normal limits

AFI Sum(cm)     %Tile       Largest Pocket(cm)
16.27           59

RUQ(cm)       RLQ(cm)       LUQ(cm)        LLQ(cm)
4.36
Biophysical Evaluation

Amniotic F.V:   Within normal limits       F. Tone:        Observed
F. Movement:    Observed                   Score:          [DATE]
F. Breathing:   Observed
Gestational Age

Clinical EDD:  30w 4d                                        EDD:   08/22/17
Best:          30w 4d     Det. By:  Clinical EDD             EDD:   08/22/17
Impression

Single living intrauterine pregnancy at 30w 4d.
Breech presentation.
Placenta .
Normal amniotic fluid volume.
a 7-8mm pericardial effusion is noted
no other signs of hydrops are seen
the m-mode echocardiography demonstrated [DATE] atrial to
ventricular contraction ratio with FHR in the 222's; this is
consistent with supraventricular tachycardia
BPP [DATE]

I discussed the implications with the patient and need for an
assessment of the percentage of time the fetus is in SVT and
given the effusion increase that she will likely meet criteria of
at least 50% of the EFM in SVT and will need to be loaded
with digoxin while on telemetry.  The patient insisted that her
OB physician facilitate a transfer of care to [HOSPITAL] [facility]
as she prefers that location as opposed to [HOSPITAL],
[place]HOSPITAL].
Recommendations

I relayed my impression and recommendation to Dr. Clarence Earl
(patient's OB) who is in process of facilitating desired transfer
to [HOSPITAL] [facility] for inpatient assessment and likely
need for digoxin/sotalol for rate control in this fetus with
worsening pericardial effusion.
